# Patient Record
Sex: Female | Born: 2000
Health system: Southern US, Community
[De-identification: ages and names within clinical notes are randomized; demographics above are authoritative.]

## PROBLEM LIST (undated history)

## (undated) DIAGNOSIS — N730 Acute parametritis and pelvic cellulitis: Secondary | ICD-10-CM

## (undated) DIAGNOSIS — A6000 Herpesviral infection of urogenital system, unspecified: Secondary | ICD-10-CM

## (undated) DIAGNOSIS — B9689 Other specified bacterial agents as the cause of diseases classified elsewhere: Secondary | ICD-10-CM

## (undated) DIAGNOSIS — N760 Acute vaginitis: Secondary | ICD-10-CM

## (undated) HISTORY — DX: Other specified bacterial agents as the cause of diseases classified elsewhere: N76.0

## (undated) HISTORY — DX: Herpesviral infection of urogenital system, unspecified: A60.00

## (undated) HISTORY — DX: Acute parametritis and pelvic cellulitis: N73.0

## (undated) HISTORY — DX: Acute vaginitis: B96.89

---
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
## 2022-12-25 ENCOUNTER — Other Ambulatory Visit: Payer: Self-pay

## 2022-12-25 ENCOUNTER — Emergency Department: Payer: MEDICAID

## 2022-12-25 ENCOUNTER — Emergency Department
Admission: EM | Admit: 2022-12-25 | Discharge: 2022-12-25 | Disposition: A | Discharge status: Home / Self Care | Payer: MEDICAID | Attending: Emergency Medicine | Admitting: Emergency Medicine

## 2022-12-25 DIAGNOSIS — N760 Acute vaginitis: Secondary | ICD-10-CM | POA: Diagnosis not present

## 2022-12-25 DIAGNOSIS — R109 Unspecified abdominal pain: Secondary | ICD-10-CM | POA: Diagnosis present

## 2022-12-25 DIAGNOSIS — B9689 Other specified bacterial agents as the cause of diseases classified elsewhere: Secondary | ICD-10-CM

## 2022-12-25 LAB — WET PREP, GENITAL
Sperm: NONE SEEN
Trich, Wet Prep: NONE SEEN
WBC, Wet Prep HPF POC: <10 (ref <10)
Yeast Wet Prep HPF POC: NONE SEEN

## 2022-12-25 LAB — URINALYSIS, ROUTINE W REFLEX MICROSCOPIC
Bacteria, UA: NONE SEEN
Bilirubin Urine: NEGATIVE
Glucose, UA: NEGATIVE mg/dL
Hgb urine dipstick: NEGATIVE
Ketones, ur: 5 mg/dL — AB
Nitrite: NEGATIVE
Protein, ur: 30 mg/dL — AB
Specific Gravity, Urine: 1.029 (ref 1.005–1.030)
pH: 5 (ref 5.0–8.0)

## 2022-12-25 LAB — COMPREHENSIVE METABOLIC PANEL
ALT: 12 U/L (ref 0–44)
AST: 20 U/L (ref 15–41)
Albumin: 4.4 g/dL (ref 3.5–5.0)
Alkaline Phosphatase: 55 U/L (ref 38–126)
Anion gap: 10 (ref 5–15)
BUN: 9 mg/dL (ref 6–20)
CO2: 21 mmol/L — ABNORMAL LOW (ref 22–32)
Calcium: 9.2 mg/dL (ref 8.9–10.3)
Chloride: 105 mmol/L (ref 98–111)
Creatinine, Ser: 0.82 mg/dL (ref 0.44–1.00)
GFR, Estimated: >60 mL/min (ref >60)
Glucose, Bld: 82 mg/dL (ref 70–99)
Potassium: 4 mmol/L (ref 3.5–5.1)
Sodium: 136 mmol/L (ref 135–145)
Total Bilirubin: 1.2 mg/dL — ABNORMAL HIGH (ref <1.2)
Total Protein: 8.1 g/dL (ref 6.5–8.1)

## 2022-12-25 LAB — CBC
HCT: 39.8 % (ref 36.0–46.0)
Hemoglobin: 13.3 g/dL (ref 12.0–15.0)
MCH: 28.7 pg (ref 26.0–34.0)
MCHC: 33.4 g/dL (ref 30.0–36.0)
MCV: 85.8 fL (ref 80.0–100.0)
Platelets: 249 10*3/uL (ref 150–400)
RBC: 4.64 MIL/uL (ref 3.87–5.11)
RDW: 11.9 % (ref 11.5–15.5)
WBC: 6 10*3/uL (ref 4.0–10.5)
nRBC: 0 % (ref 0.0–0.2)

## 2022-12-25 LAB — CHLAMYDIA/NGC RT PCR (ARMC ONLY)
Chlamydia Tr: NOT DETECTED
N gonorrhoeae: NOT DETECTED

## 2022-12-25 LAB — LIPASE, BLOOD: Lipase: 32 U/L (ref 11–51)

## 2022-12-25 LAB — POC URINE PREG, ED: Preg Test, Ur: NEGATIVE

## 2022-12-25 MED ORDER — ONDANSETRON HCL 4 MG/2ML IJ SOLN
4.0000 mg | Freq: Once | INTRAMUSCULAR | Status: AC
  Administered 2022-12-25: 4 mg via INTRAVENOUS
  Filled 2022-12-25: qty 2

## 2022-12-25 MED ORDER — CLINDAMYCIN PHOSPHATE 2 % VA CREA: 1.0000 | TOPICAL_CREAM | Freq: Every day | VAGINAL | 0 refills | Status: AC

## 2022-12-25 MED ORDER — CLINDAMYCIN PHOSPHATE 2 % VA CREA: 1.0000 | TOPICAL_CREAM | Freq: Every day | VAGINAL | 0 refills | Status: DC

## 2022-12-25 NOTE — ED Provider Notes (Signed)
[facility] Provider Note    Event Date/Time   First MD Initiated Contact with Patient 12/25/22 [phone]     (approximate)   History   Abdominal Pain   HPI  Jennifer Donovan is a 22 y.o. female with a past medical history of anxiety, depression, obesity who presents today for evaluation of back pain and abdominal pain for the past 1 week.  Patient reports that her symptoms began approximately 1.5 weeks after increasing her [facility] shots.  She reports that she has pain suprapubically and also in her right flank.  She denies history of kidney stones.  She denies vaginal discharge or vaginal bleeding.  She has also had nausea, vomiting, diarrhea sporadically over the course of the last week as well.  No fevers or chills.  She does note that she has had intermittent bloody stool for the past 1 year.  No family history of GI problems.  Patient Active Problem List   Diagnosis Date Noted   Chronic pain of right ankle 04/08/2019   Cough 02/06/2019   Anxiety and depression 08/18/2018   Recurrent genital herpes simplex type 2 infection 04/08/2018   Allergic rhinitis 08/25/2012   Eczema 09/07/2011          Physical Exam   Triage Vital Signs: ED Triage Vitals  Encounter Vitals Group     BP 12/25/22 0937 (!) 133/94     Systolic BP Percentile --      Diastolic BP Percentile --      Pulse Rate 12/25/22 0937 96     Resp 12/25/22 0937 16     Temp 12/25/22 0937 98.4 F (36.9 C)     Temp src --      SpO2 12/25/22 0937 97 %     Weight 12/25/22 0938 176 lb (79.8 kg)     Height 12/25/22 0938 5' 3.5" (1.613 m)     Head Circumference --      Peak Flow --      Pain Score 12/25/22 0937 7     Pain Loc --      Pain Education --      Exclude from Growth Chart --     Most recent vital signs: Vitals:   12/25/22 0937 12/25/22 1227  BP: (!) 133/94 116/74  Pulse: 96 90  Resp: 16 16  Temp: 98.4 F (36.9 C) 97.9 F (36.6 C)  SpO2: 97% 98%    Physical  Exam Vitals and nursing note reviewed.  Constitutional:      General: Awake and alert. No acute distress.    Appearance: Normal appearance. The patient is overweight.  HENT:     Head: Normocephalic and atraumatic.     Mouth: Mucous membranes are moist.  Eyes:     General: PERRL. Normal EOMs        Right eye: No discharge.        Left eye: No discharge.     Conjunctiva/sclera: Conjunctivae normal.  Cardiovascular:     Rate and Rhythm: Normal rate and regular rhythm.     Pulses: Normal pulses.  Pulmonary:     Effort: Pulmonary effort is normal. No respiratory distress.     Breath sounds: Normal breath sounds.  Abdominal:     Abdomen is soft. There is mild suprapubic and right lower quadrant and left lower quadrant abdominal tenderness. No rebound or guarding. No distention. Musculoskeletal:        General: No swelling. Normal range of motion.  Cervical back: Normal range of motion and neck supple.  Skin:    General: Skin is warm and dry.     Capillary Refill: Capillary refill takes less than 2 seconds.     Findings: No rash.  Neurological:     Mental Status: The patient is awake and alert.      ED Results / Procedures / Treatments   Labs (all labs ordered are listed, but only abnormal results are displayed) Labs Reviewed  WET PREP, GENITAL - Abnormal; Notable for the following components:      Result Value   Clue Cells Wet Prep HPF POC PRESENT (*)    All other components within normal limits  COMPREHENSIVE METABOLIC PANEL - Abnormal; Notable for the following components:   CO2 21 (*)    Total Bilirubin 1.2 (*)    All other components within normal limits  URINALYSIS, ROUTINE W REFLEX MICROSCOPIC - Abnormal; Notable for the following components:   Color, Urine AMBER (*)    APPearance CLOUDY (*)    Ketones, ur 5 (*)    Protein, ur 30 (*)    Leukocytes,Ua MODERATE (*)    All other components within normal limits  CHLAMYDIA/NGC RT PCR ([facility] ONLY)            URINE  CULTURE  LIPASE, BLOOD  CBC  POC URINE PREG, ED     EKG     RADIOLOGY I independently reviewed and interpreted imaging and agree with radiologists findings.     PROCEDURES:  Critical Care performed:   Procedures   MEDICATIONS ORDERED IN ED: Medications  ondansetron (ZOFRAN) injection 4 mg (4 mg Intravenous Given 12/25/22 1018)     IMPRESSION / MDM / ASSESSMENT AND PLAN / ED COURSE  I reviewed the triage vital signs and the nursing notes.   Differential diagnosis includes, but is not limited to, medication side effect, gastroenteritis, UTI, pyelonephritis, nephrolithiasis.  Patient is awake and alert, hemodynamically stable and afebrile.  She is nontoxic in appearance.  She is currently sitting crosslegged on the stretcher, talking on her phone and typing on her laptop.  She is nontoxic in appearance.  Labs obtained in triage reveal no leukocytosis.  Normal LFTs and lipase.  She does have leukocytes in her urine, and when discussing this result, patient reports that she gets recurrent BV.  CT scan was negative for stone.  She agreed to testing today.  Her swab was positive for BV today.  She reports that she normally is treated with metronidazole, and has trialed both p.o. and intravaginal metronidazole, which takes care of the problem while she is taking the medication, but then her symptoms recur promptly.  I recommended follow-up with GYN due to her recurrent BV.  However, also offered alternative treatment in the form of intravaginal clindamycin instead of intravaginal metronidazole and patient would like to try this as it is something different.  We discussed strict return precautions the importance of close outpatient follow-up.  She did not wish to wait for the results of her gonorrhea/chlamydia test.  I also recommended that she discuss her [facility] dosage with her PCP as she is concerned about the dose.  She was also given the information for gastroenterology given her  intermittent bloody stool for 1 year.  There are no abnormal findings on her CT scan and her H&H is normal.  No infectious symptoms to suggest infectious colitis.  Patient is overall quite well in appearance, is tolerating p.o., pain is controlled, and she  is sitting up in the stretcher typing on her laptop.  She understands return precautions and agrees to follow-up.  She was discharged in stable condition.   Patient's presentation is most consistent with acute complicated illness / injury requiring diagnostic workup.      FINAL CLINICAL IMPRESSION(S) / ED DIAGNOSES   Final diagnoses:  BV (bacterial vaginosis)     Rx / DC Orders   ED Discharge Orders          Ordered    clindamycin (CLEOCIN) 2 % vaginal cream  Daily at bedtime,   Status:  Discontinued        12/25/22 1212    clindamycin (CLEOCIN) 2 % vaginal cream  Daily at bedtime        12/25/22 1213             Note:  This document was prepared using Dragon voice recognition software and may include unintentional dictation errors.   Jackelyn Hoehn, PA-C 12/25/22 1436    Janith Lima, MD 12/25/22 1440

## 2022-12-25 NOTE — ED Triage Notes (Signed)
Pt to ED for abd pain and back pain for 1 week. Reports sx started after increase in wegovy.

## 2022-12-25 NOTE — Discharge Instructions (Addendum)
Your BV test is positive.  You may use the intravaginal applicators to treat this.  As we discussed, please follow-up with OB/GYN given that this is a recurrent issue for you.  You are also given the information for gastroenterology to make an appointment for your rectal bleeding.  Your blood work and CT scan were normal.  Please follow-up with your outpatient provider.  Please return for any new, worsening, or change in symptoms or other concerns.  It was a pleasure caring for you today.

## 2022-12-27 LAB — URINE CULTURE: Culture: <10000 — AB
